# Patient Record
Sex: Male | Born: 1980 | Hispanic: No | Marital: Single | Smoking: Never smoker
Health system: Southern US, Community
[De-identification: ages and names within clinical notes are randomized; demographics above are authoritative.]

---

## 2017-01-03 ENCOUNTER — Encounter (HOSPITAL_COMMUNITY): Payer: Self-pay

## 2017-01-03 ENCOUNTER — Emergency Department (HOSPITAL_COMMUNITY)
Admission: EM | Admit: 2017-01-03 | Discharge: 2017-01-03 | Disposition: A | Discharge status: Home / Self Care | Payer: Self-pay | Attending: Emergency Medicine | Admitting: Emergency Medicine

## 2017-01-03 DIAGNOSIS — L237 Allergic contact dermatitis due to plants, except food: Secondary | ICD-10-CM | POA: Insufficient documentation

## 2017-01-03 MED ORDER — METHYLPREDNISOLONE SODIUM SUCC 125 MG IJ SOLR
125.0000 mg | Freq: Once | INTRAMUSCULAR | Status: AC
  Administered 2017-01-03: 125 mg via INTRAMUSCULAR
  Filled 2017-01-03: qty 2

## 2017-01-03 MED ORDER — HYDROXYZINE PAMOATE 25 MG PO CAPS: 25.0000 mg | ORAL_CAPSULE | Freq: Three times a day (TID) | ORAL | 0 refills | Status: AC | PRN

## 2017-01-03 MED ORDER — LORATADINE 10 MG PO TABS
10.0000 mg | ORAL_TABLET | Freq: Once | ORAL | Status: AC
  Administered 2017-01-03: 10 mg via ORAL
  Filled 2017-01-03: qty 1

## 2017-01-03 MED ORDER — DEXAMETHASONE 4 MG PO TABS: 4.0000 mg | ORAL_TABLET | Freq: Two times a day (BID) | ORAL | 0 refills | Status: AC

## 2017-01-03 NOTE — Discharge Instructions (Signed)
Please wash her hands frequently. Wash her clothes and linens daily until this has resolved. Oil from the poison oak rash can make it more difficult to get rid of the rash. Caladryl may be helpful as an over-the-counter medication. Please use Claritin for nondrowsy histamine during the day. Please use Vistaril every 6 hours as needed for itching and burning. This medication may cause drowsiness. Please do not drive a vehicle, operating machinery, drink alcohol, or participate in activities requiring concentration when taking this medication. Please use Decadron 2 times daily with food until all taken. See your primary physician or return to the emergency department if not improving.

## 2017-01-03 NOTE — ED Provider Notes (Signed)
[facility] Provider Note   CSN: [id number] Arrival date & time: 01/03/17  1044     History   Chief Complaint Chief Complaint  Patient presents with  . Rash    HPI Trevor Preston is a 36 y.o. male.  Patient is a 36 year old male who presents to the emergency department because of poison oak exposure.  The patient states that he has been having problem with a rash from poison ivy/poison oak for the last 4 or 5 days. It is getting worse instead of getting better in spite of use of over-the-counter medications. The patient denies any difficulty with his breathing or swallowing. He's not had any wheezing. He states however the rash itself seems to be spreading from his arms and now on certain areas of his legs as well.  The patient request assistance with the rash and with the itching and burning.   The history is provided by the patient.    History reviewed. No pertinent past medical history.  There are no active problems to display for this patient.   History reviewed. No pertinent surgical history.     Home Medications    Prior to Admission medications   Not on File    Family History No family history on file.  Social History Social History  Substance Use Topics  . Smoking status: Never Smoker  . Smokeless tobacco: Never Used  . Alcohol use No     Allergies   Patient has no known allergies.   Review of Systems Review of Systems  Constitutional: Negative for activity change.       All ROS Neg except as noted in HPI  HENT: Negative for nosebleeds.   Eyes: Negative for photophobia and discharge.  Respiratory: Negative for cough, shortness of breath and wheezing.   Cardiovascular: Negative for chest pain and palpitations.  Gastrointestinal: Negative for abdominal pain and blood in stool.  Genitourinary: Negative for dysuria, frequency and hematuria.  Musculoskeletal: Negative for arthralgias, back pain and neck pain.  Skin: Positive for rash.    Neurological: Negative for dizziness, seizures and speech difficulty.  Psychiatric/Behavioral: Negative for confusion and hallucinations.     Physical Exam Updated Vital Signs BP (!) 148/90 (BP Location: Left Arm)   Pulse 94   Temp 97.9 F (36.6 C) (Oral)   Resp 18   Ht  (1.753 m)   Wt 104.3 kg (230 lb)   SpO2 98%   BMI 33.97 kg/m   Physical Exam  Constitutional: He is oriented to person, place, and time. He appears well-developed and well-nourished.  Non-toxic appearance.  HENT:  Head: Normocephalic.  Right Ear: Tympanic membrane and external ear normal.  Left Ear: Tympanic membrane and external ear normal.  Airway is patent.  Eyes: Pupils are equal, round, and reactive to light. EOM and lids are normal.  Neck: Normal range of motion. Neck supple. Carotid bruit is not present.  Cardiovascular: Normal rate, regular rhythm, normal heart sounds, intact distal pulses and normal pulses.   Pulmonary/Chest: Breath sounds normal. No respiratory distress.  There is symmetrical rise and fall of the chest. The patient speaks in complete sentences without problem.  Abdominal: Soft. Bowel sounds are normal. There is no tenderness. There is no guarding.  Musculoskeletal: Normal range of motion.  Lymphadenopathy:       Head (right side): No submandibular adenopathy present.       Head (left side): No submandibular adenopathy present.    He has no cervical adenopathy.  Neurological:  He is alert and oriented to person, place, and time. He has normal strength. No cranial nerve deficit or sensory deficit.  Skin: Skin is warm and dry. Rash noted.  Red papular rash with blisters noted from the elbow to the wrist on the right. There are a few lesions noted of the left elbow and forearm. There are also a few lesions noted of the left lower leg.  Psychiatric: He has a normal mood and affect. His speech is normal.  Nursing note and vitals reviewed.    ED Treatments / Results  Labs (all  labs ordered are listed, but only abnormal results are displayed) Labs Reviewed - No data to display  EKG  EKG Interpretation None       Radiology No results found.  Procedures Procedures (including critical care time)  Medications Ordered in ED Medications  methylPREDNISolone sodium succinate (SOLU-MEDROL) 125 mg/2 mL injection 125 mg (not administered)  loratadine (CLARITIN) tablet 10 mg (not administered)     Initial Impression / Assessment and Plan / ED Course  I have reviewed the triage vital signs and the nursing notes.  Pertinent labs & imaging results that were available during my care of the patient were reviewed by me and considered in my medical decision making (see chart for details).       Final Clinical Impressions(s) / ED Diagnoses MDM Vital signs within normal limits. Examination is consistent with exposure to poison oak. No acute or anaphylactic type reaction. Patient will be treated with steroids and antihistamine. The patient is to return to the emergency department if any changes, problems, or concerns. Patient is in agreement with this plan.    Final diagnoses:  Allergic contact dermatitis due to plants, except food    New Prescriptions New Prescriptions   DEXAMETHASONE (DECADRON) 4 MG TABLET    Take 1 tablet (4 mg total) by mouth 2 (two) times daily with a meal.   HYDROXYZINE (VISTARIL) 25 MG CAPSULE    Take 1 capsule (25 mg total) by mouth 3 (three) times daily as needed.     Ivery Quale, PA-C 01/03/17 1240    Donnetta Hutching, MD 01/05/17 [phone]

## 2017-01-03 NOTE — ED Triage Notes (Signed)
Patient c/o poison oak to right and left arm. Patient states rash with itching started Tuesday after exposure. Trying home remedies with no relief.

## 2017-01-03 NOTE — ED Notes (Signed)
Pt with poison oak rash to right arm and right leg, states spreading to left arm.  Right arm swollen as well.

## 2017-01-11 ENCOUNTER — Encounter (HOSPITAL_COMMUNITY): Payer: Self-pay | Admitting: Emergency Medicine

## 2017-01-11 ENCOUNTER — Emergency Department (HOSPITAL_COMMUNITY)
Admission: EM | Admit: 2017-01-11 | Discharge: 2017-01-11 | Disposition: A | Discharge status: Home / Self Care | Payer: Self-pay | Attending: Emergency Medicine | Admitting: Emergency Medicine

## 2017-01-11 DIAGNOSIS — L237 Allergic contact dermatitis due to plants, except food: Secondary | ICD-10-CM | POA: Insufficient documentation

## 2017-01-11 DIAGNOSIS — L255 Unspecified contact dermatitis due to plants, except food: Secondary | ICD-10-CM

## 2017-01-11 MED ORDER — PREDNISONE 10 MG PO TABS: ORAL_TABLET | ORAL | 0 refills | Status: AC

## 2017-01-11 MED ORDER — PREDNISONE 50 MG PO TABS
60.0000 mg | ORAL_TABLET | Freq: Once | ORAL | Status: AC
  Administered 2017-01-11: 14:00:00 60 mg via ORAL
  Filled 2017-01-11: qty 1

## 2017-01-11 NOTE — Discharge Instructions (Signed)
Take your next dose of prednisone tomorrow as you have received today dose here. You may take each daily dose at one time ( no need to spread out the tablets). If you are itchy, you may take benadryl or try gold bond anti itch cream. Cold ice packs can also help relieve localized itching.

## 2017-01-11 NOTE — ED Triage Notes (Signed)
Seen here on Sept 23 rd for poison oak, areas has increased and continues to have itching.

## 2017-01-12 NOTE — ED Provider Notes (Signed)
[facility] Provider Note   CSN: [id number] Arrival date & time: 01/11/17  1246     History   Chief Complaint Chief Complaint  Patient presents with  . Follow-up    HPI Trevor Preston is a 36 y.o. male presenting for re-evaluation of his poison ivy.  He was treated with a 6 day course of decadron which he completed 3 days ago, now with new flare of his rash. He denies fevers, chills, sob, wheezing or cough. He has washed his clothing and tools used the day he was exposed, denies new exposure.  The history is provided by the patient.    History reviewed. No pertinent past medical history.  There are no active problems to display for this patient.   History reviewed. No pertinent surgical history.     Home Medications    Prior to Admission medications   Medication Sig Start Date End Date Taking? Authorizing Provider  dexamethasone (DECADRON) 4 MG tablet Take 1 tablet (4 mg total) by mouth 2 (two) times daily with a meal. 01/03/17   Ivery Quale, PA-C  hydrOXYzine (VISTARIL) 25 MG capsule Take 1 capsule (25 mg total) by mouth 3 (three) times daily as needed. 01/03/17   Ivery Quale, PA-C  predniSONE (DELTASONE) 10 MG tablet Take 6 tabs daily by mouth for 1 day,  Then 5 tabs daily for 2 days,  4 tabs daily for 2 days,  3 tabs daily for 2 days,  2 tabs daily for 2 days,  Then 1 tab daily for 2 days. 01/11/17   Burgess Amor, PA-C    Family History History reviewed. No pertinent family history.  Social History Social History  Substance Use Topics  . Smoking status: Never Smoker  . Smokeless tobacco: Never Used  . Alcohol use No     Allergies   Patient has no known allergies.   Review of Systems Review of Systems  Constitutional: Negative for chills and fever.  Respiratory: Negative for shortness of breath and wheezing.   Skin: Positive for rash.  Neurological: Negative for numbness.     Physical Exam Updated Vital Signs BP (!) 145/102 (BP Location:  Left Wrist)   Pulse 99   Temp 98 F (36.7 C) (Oral)   Resp 16   Ht  (1.753 m)   Wt 104.3 kg (230 lb)   SpO2 100%   BMI 33.97 kg/m   Physical Exam  Constitutional: He appears well-developed and well-nourished. No distress.  HENT:  Head: Normocephalic.  Neck: Neck supple.  Cardiovascular: Normal rate.   Pulmonary/Chest: Effort normal. He has no wheezes.  Musculoskeletal: Normal range of motion. He exhibits no edema.  Skin: Rash noted. Rash is vesicular.  Erythematous patchy rash bilateral upper arms and forearms, with small vesicles, surrounding original fading patches of rash. No pustules, no drainage, no red streaking.     ED Treatments / Results  Labs (all labs ordered are listed, but only abnormal results are displayed) Labs Reviewed - No data to display  EKG  EKG Interpretation None       Radiology No results found.  Procedures Procedures (including critical care time)  Medications Ordered in ED Medications  predniSONE (DELTASONE) tablet 60 mg (60 mg Oral Given 01/11/17 1414)     Initial Impression / Assessment and Plan / ED Course  I have reviewed the triage vital signs and the nursing notes.  Pertinent labs & imaging results that were available during my care of the patient were reviewed by  me and considered in my medical decision making (see chart for details).     Pt placed on longer prednisone taper. Also discussed strategies for itch reduction, prn f/u anticipated.  Final Clinical Impressions(s) / ED Diagnoses   Final diagnoses:  Dermatitis due to plants, including poison ivy, sumac, and oak    New Prescriptions Discharge Medication List as of 01/11/2017  1:51 PM    START taking these medications   Details  predniSONE (DELTASONE) 10 MG tablet Take 6 tabs daily by mouth for 1 day,  Then 5 tabs daily for 2 days,  4 tabs daily for 2 days,  3 tabs daily for 2 days,  2 tabs daily for 2 days,  Then 1 tab daily for 2 days., Print           Burgess Amor, PA-C 01/12/17 1104    Mesner, Barbara Cower, MD 01/12/17 [phone]

## 2017-11-10 ENCOUNTER — Ambulatory Visit (HOSPITAL_COMMUNITY)
Admission: RE | Admit: 2017-11-10 | Discharge: 2017-11-10 | Disposition: A | Discharge status: Home / Self Care | Payer: Self-pay | Source: Ambulatory Visit | Attending: Preventative Medicine | Admitting: Preventative Medicine

## 2017-11-10 ENCOUNTER — Other Ambulatory Visit (HOSPITAL_COMMUNITY): Payer: Self-pay | Admitting: Preventative Medicine

## 2017-11-10 DIAGNOSIS — R1011 Right upper quadrant pain: Secondary | ICD-10-CM

## 2017-11-10 DIAGNOSIS — R109 Unspecified abdominal pain: Secondary | ICD-10-CM

## 2020-01-08 IMAGING — CT CT ABD-PELV W/O CM
2 of 4 series · 16 of 46 positions shown, 18 images · non-contrast
Comparison: None.

CLINICAL DATA: Right flank pain for 1 day.  Hematuria.

EXAM:
CT ABDOMEN AND PELVIS WITHOUT CONTRAST
TECHNIQUE: Multidetector CT imaging of the abdomen and pelvis was performed
following the standard protocol without IV contrast.

[Series 2: axial st · axial · 0.71mm/px · z∈[+768,+1218]mm · 13 of 100 slices shown, 15 images]
[im 5/100  soft-tissue]
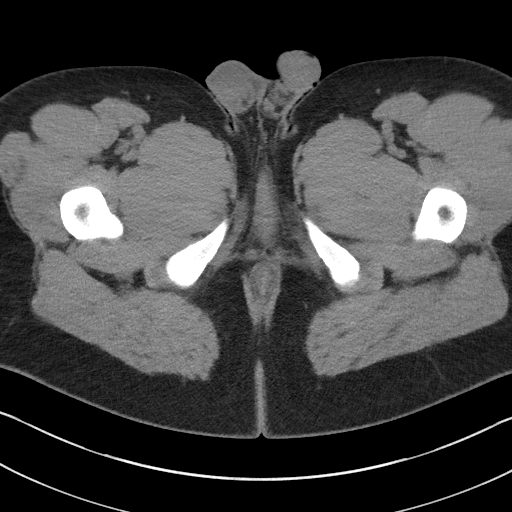
[im 5/100  bone]
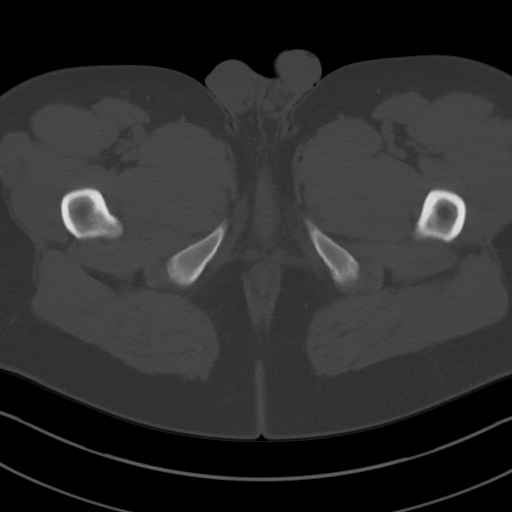
[im 13/100  soft-tissue]
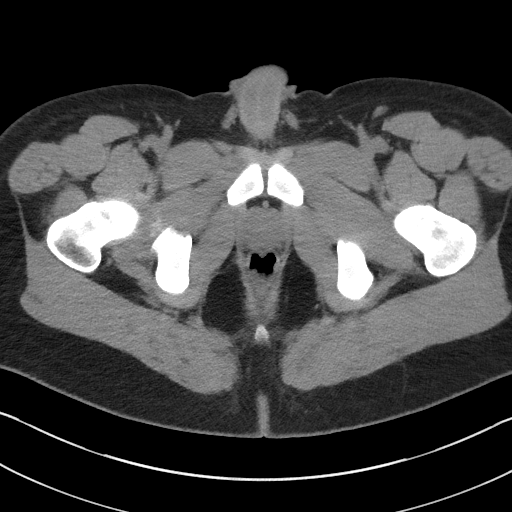
[im 21/100  soft-tissue]
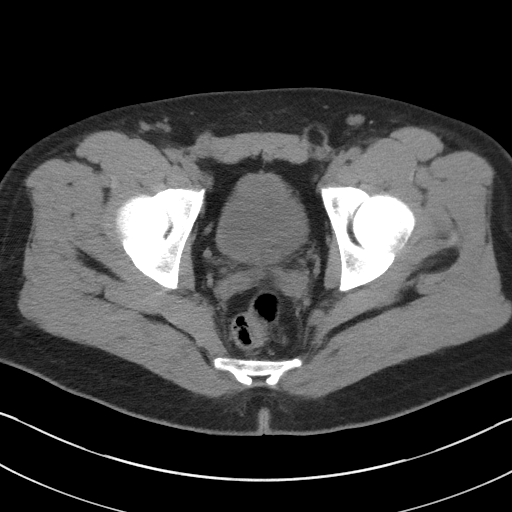
[im 29/100  soft-tissue]
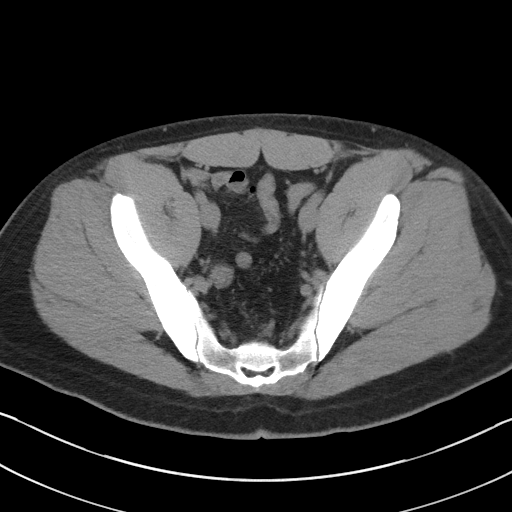
[im 34/100  soft-tissue]
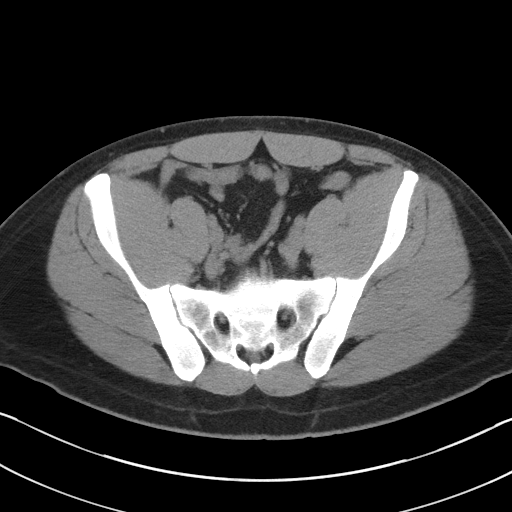
[im 42/100  soft-tissue]
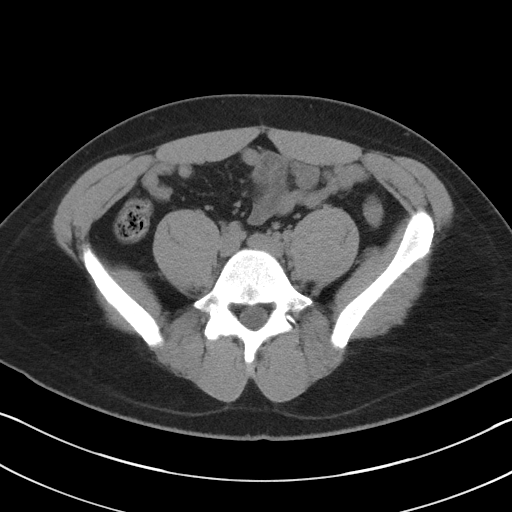
[im 50/100  soft-tissue]
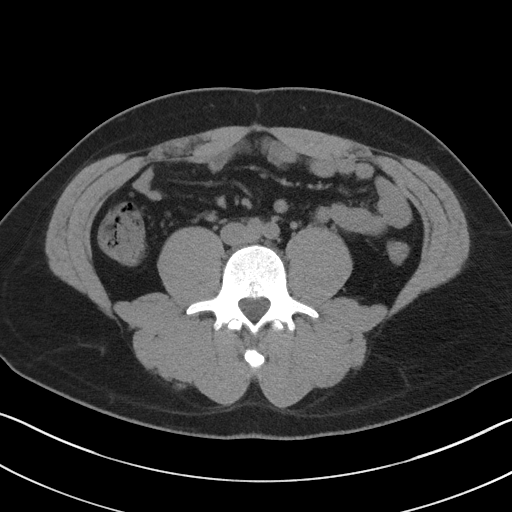
[im 58/100  soft-tissue]
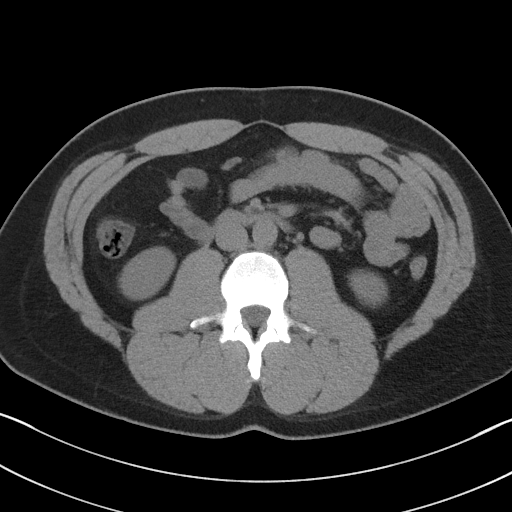
[im 67/100  soft-tissue]
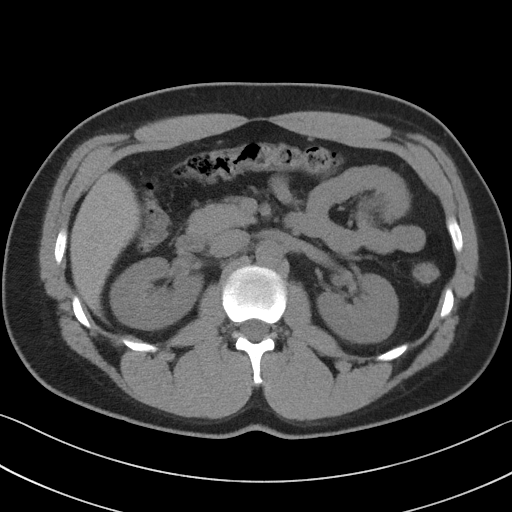
[im 67/100  bone]
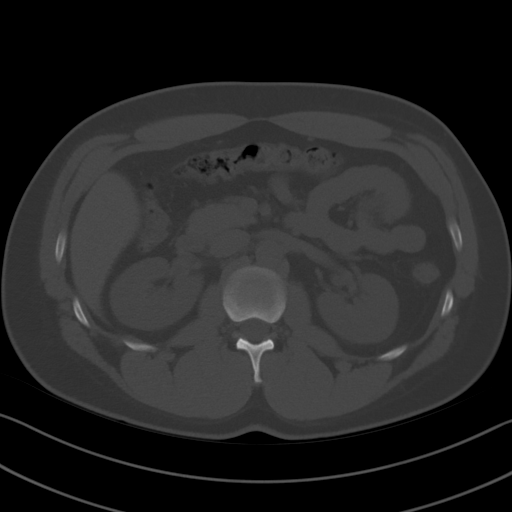
[im 71/100  soft-tissue]
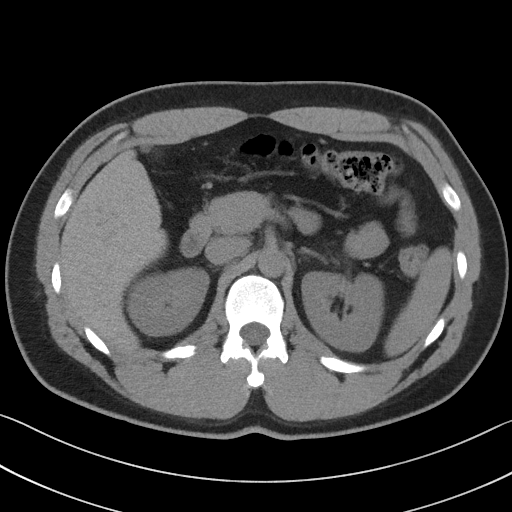
[im 79/100  soft-tissue]
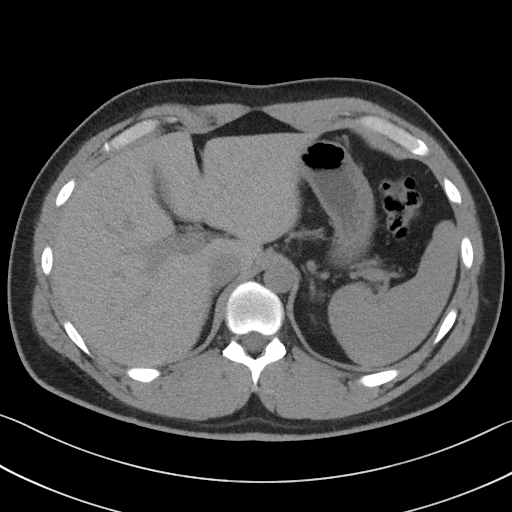
[im 87/100  soft-tissue]
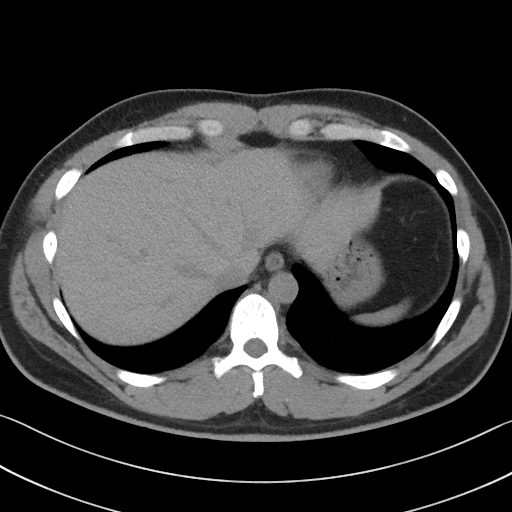
[im 95/100  soft-tissue]
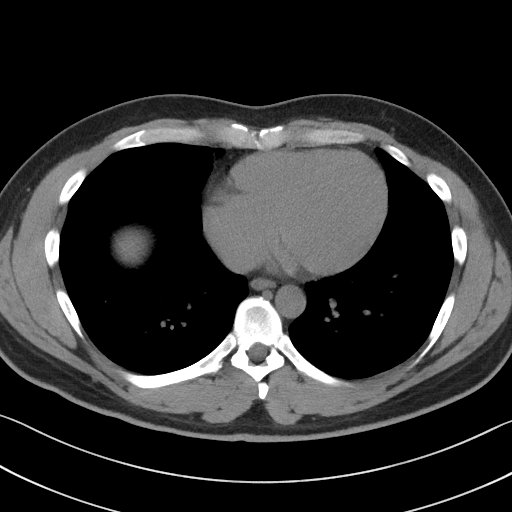

[Series 5: coronal st · coronal · 0.71mm/px · 3 of 83 slices shown]
[im 28/83  soft-tissue]
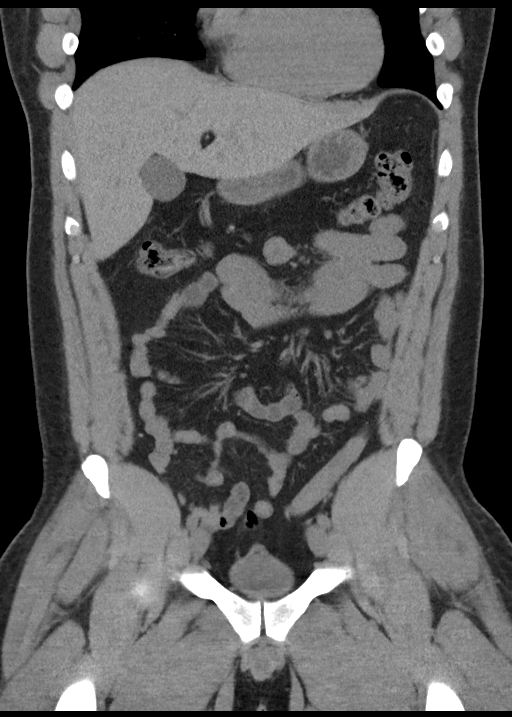
[im 37/83  soft-tissue]
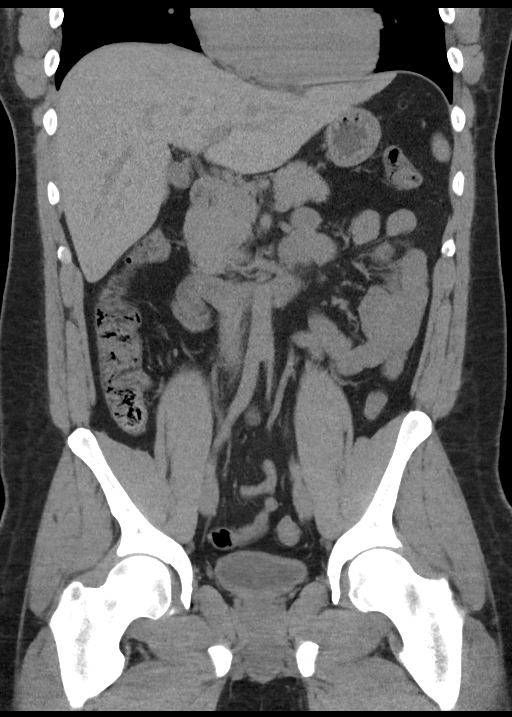
[im 46/83  soft-tissue]
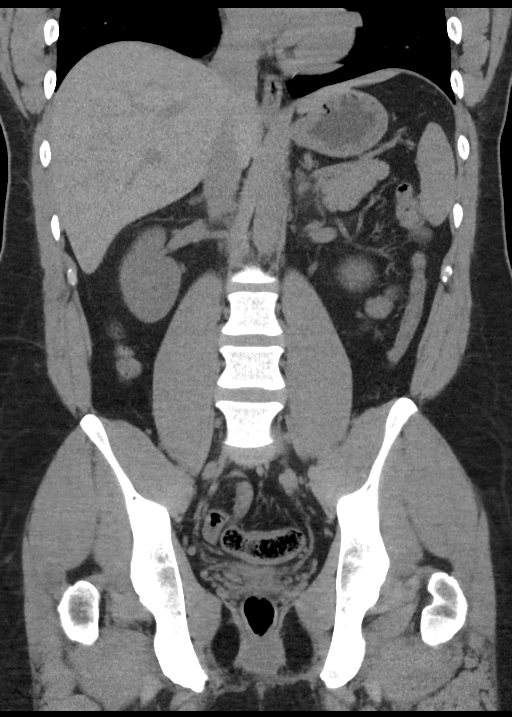

[16 of 46 positions shown; findings below may reference images not displayed]

FINDINGS: Lower chest: No acute abnormality.

Hepatobiliary: No focal liver abnormality is seen. No gallstones,
gallbladder wall thickening, or biliary dilatation.

Pancreas: Unremarkable. No pancreatic ductal dilatation or
surrounding inflammatory changes.

Spleen: Normal in size without focal abnormality.

Adrenals/Urinary Tract: Adrenal glands are unremarkable. Kidneys are
normal, without renal calculi, focal lesion, or hydronephrosis.
Bladder is unremarkable.

Stomach/Bowel: Stomach is within normal limits. Appendix appears
normal. No evidence of bowel wall thickening, distention, or
inflammatory changes.

Vascular/Lymphatic: No significant vascular findings are present. No
enlarged abdominal or pelvic lymph nodes. Scattered sub pathologic
by CT criteria mesenteric lymph nodes.

Reproductive: Prostate is unremarkable.

Other: No abdominal wall hernia or abnormality. No abdominopelvic
ascites.

Musculoskeletal: No acute or significant osseous findings.
IMPRESSION: No acute abnormalities within the abdomen or pelvis.
